# Patient Record
Sex: Male | Born: 1955 | Race: White | Hispanic: No | Smoking: Current some day smoker
Health system: Southern US, Community
[De-identification: ages and names within clinical notes are randomized; demographics above are authoritative.]

## PROBLEM LIST (undated history)

## (undated) DIAGNOSIS — E800 Hereditary erythropoietic porphyria: Secondary | ICD-10-CM

## (undated) DIAGNOSIS — C801 Malignant (primary) neoplasm, unspecified: Secondary | ICD-10-CM

## (undated) DIAGNOSIS — I499 Cardiac arrhythmia, unspecified: Secondary | ICD-10-CM

## (undated) DIAGNOSIS — E039 Hypothyroidism, unspecified: Secondary | ICD-10-CM

## (undated) DIAGNOSIS — G4730 Sleep apnea, unspecified: Secondary | ICD-10-CM

## (undated) DIAGNOSIS — E785 Hyperlipidemia, unspecified: Secondary | ICD-10-CM

## (undated) HISTORY — PX: MOUTH SURGERY: SHX715

## (undated) HISTORY — PX: KNEE ARTHROSCOPY: SUR90

---

## 2020-09-14 ENCOUNTER — Other Ambulatory Visit: Payer: Self-pay | Admitting: Otolaryngology

## 2020-09-14 DIAGNOSIS — D110 Benign neoplasm of parotid gland: Secondary | ICD-10-CM

## 2020-09-27 ENCOUNTER — Ambulatory Visit
Admission: RE | Admit: 2020-09-27 | Discharge: 2020-09-27 | Disposition: A | Discharge status: Home / Self Care | Payer: Medicare Other | Source: Ambulatory Visit | Attending: Otolaryngology | Admitting: Otolaryngology

## 2020-09-27 ENCOUNTER — Other Ambulatory Visit: Payer: Self-pay

## 2020-09-27 DIAGNOSIS — D110 Benign neoplasm of parotid gland: Secondary | ICD-10-CM | POA: Diagnosis present

## 2020-09-27 IMAGING — MR MR FACE/TRIGEMINAL WO/W CM
5 of 8 series · 22 of 48 positions shown · IV contrast (gadavist)
Comparison: No pertinent prior exam.

CLINICAL DATA: 64-year-old male with palpable abnormality of the
parotid region. First noticed in [REDACTED]. No pain or surgery.

EXAM:
MRI FACE TRIGEMINAL WITHOUT AND WITH CONTRAST
TECHNIQUE: Multiplanar, multi-echo pulse sequences of the face and surrounding
structures, including thin-slice imaging of the trigeminal nerves,
were acquired before and after intravenous contrast administration.
CONTRAST:  9mL GADAVIST GADOBUTROL 1 MMOL/ML IV SOLN

[Series 5: T1 · sagittal · 3.0mm · 0.35mm/px · 6 of 40 slices shown (1 of 3)]
[im 1/40]
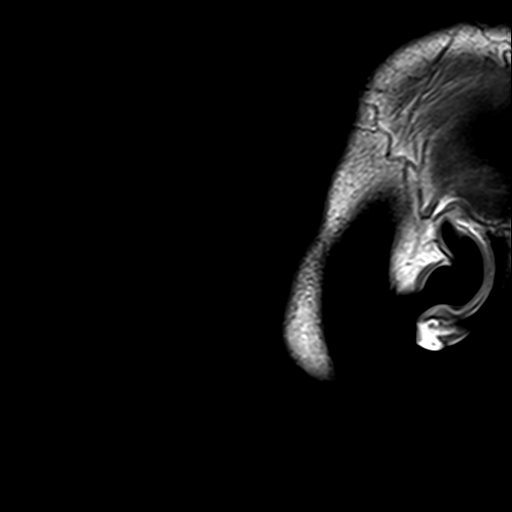
[im 8/40]
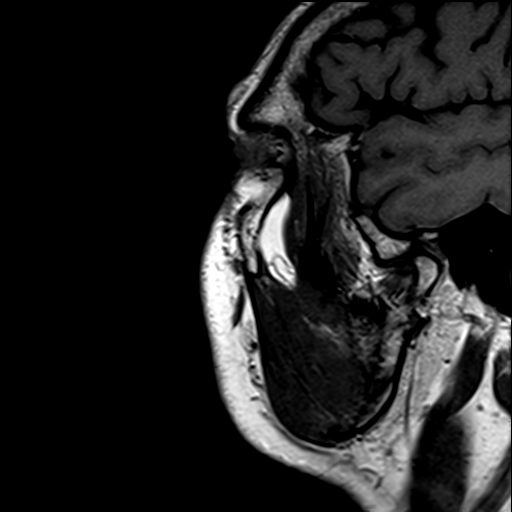
[im 16/40]
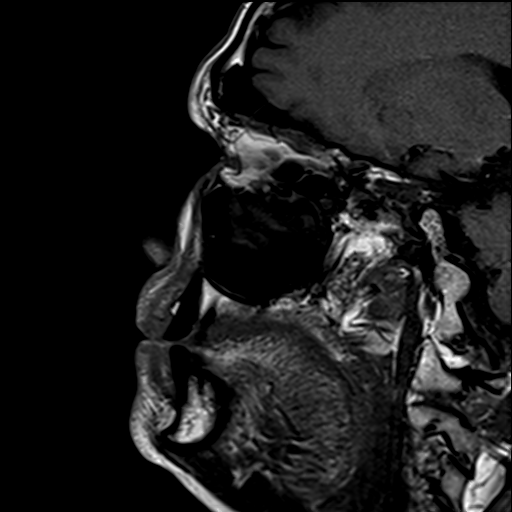
[im 24/40]
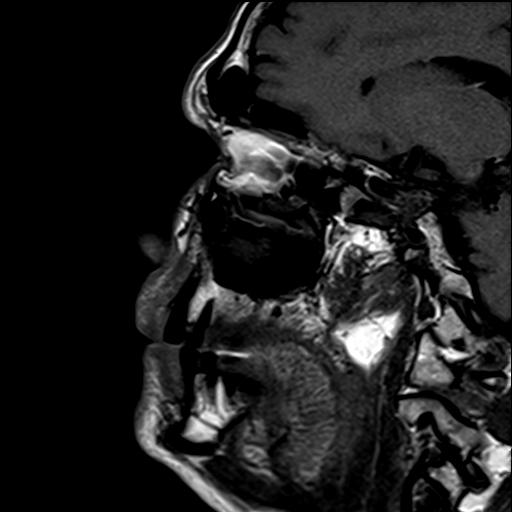
[im 32/40]
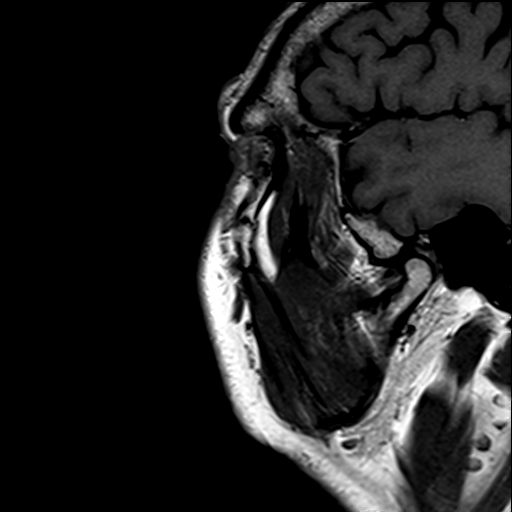
[im 40/40]
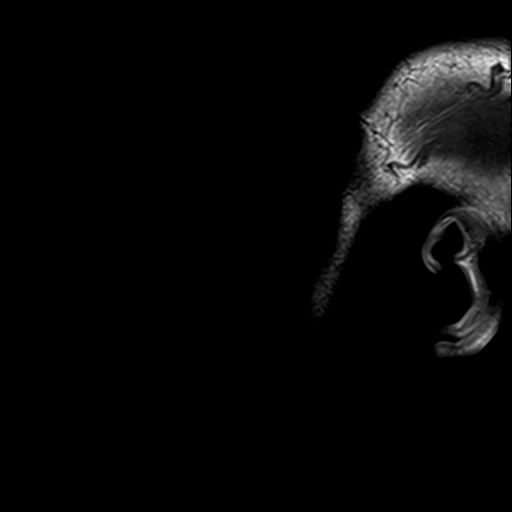

[Series 6: T2 · coronal · 3.0mm · 0.70mm/px · 5 of 40 slices shown]
[im 1/40]
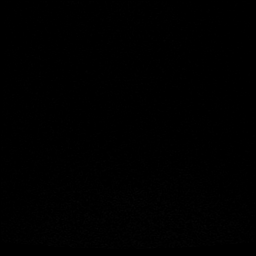
[im 10/40]
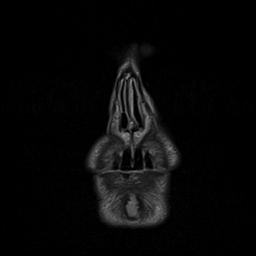
[im 20/40]
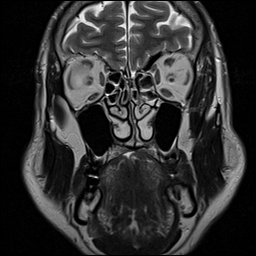
[im 30/40]
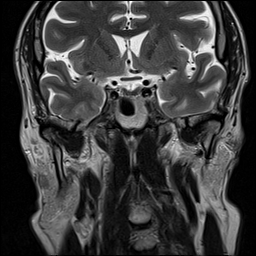
[im 40/40]
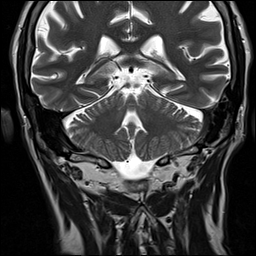

[Series 9: T1 · axial · 3.0mm · 0.35mm/px · z∈[-163,-28]mm · 5 of 35 slices shown (2 of 3)]
[im 1/35]
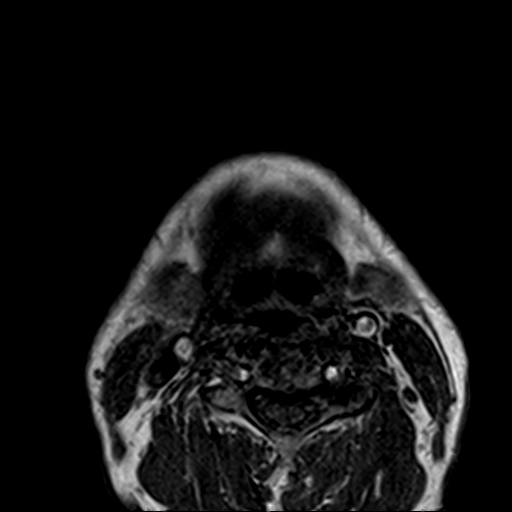
[im 9/35]
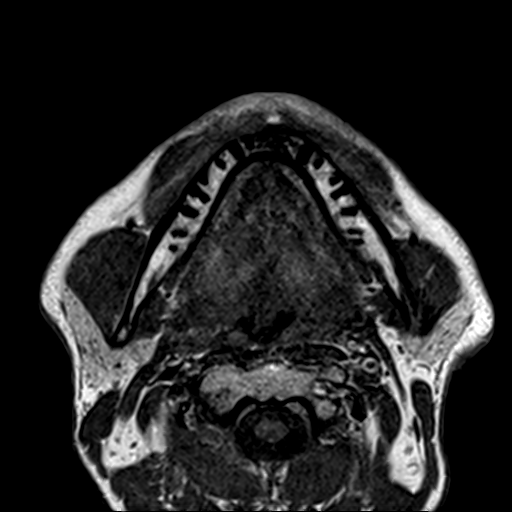
[im 18/35]
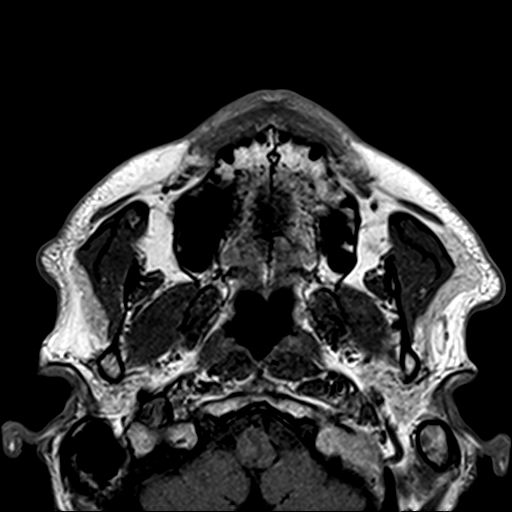
[im 26/35]
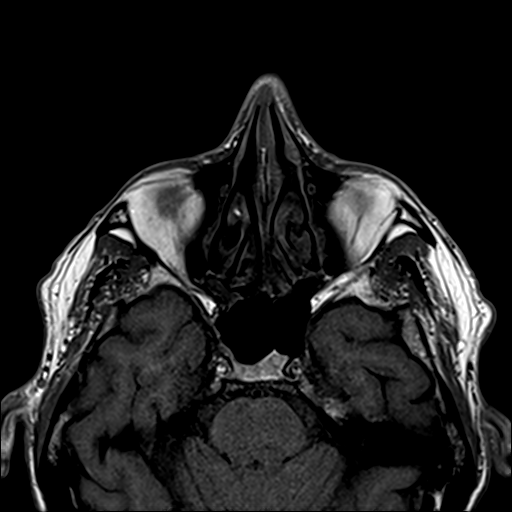
[im 35/35]
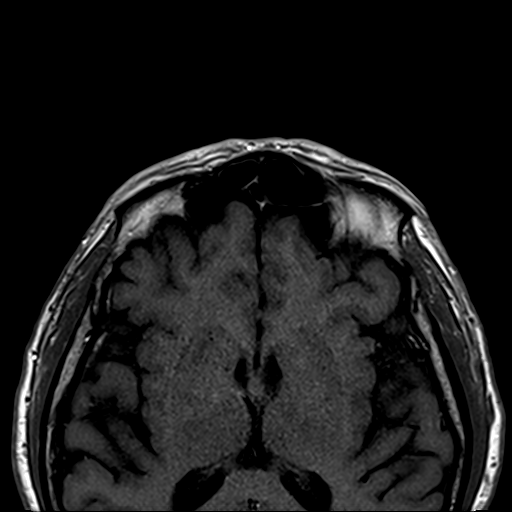

[Series 10: T1 · coronal · 3.0mm · 0.35mm/px · 5 of 40 slices shown (3 of 3)]
[im 1/40]
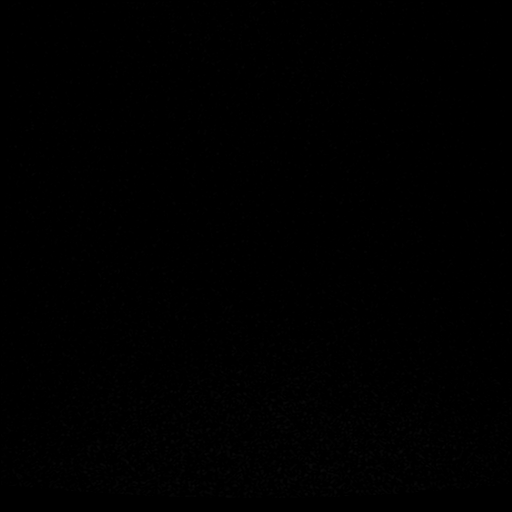
[im 10/40]
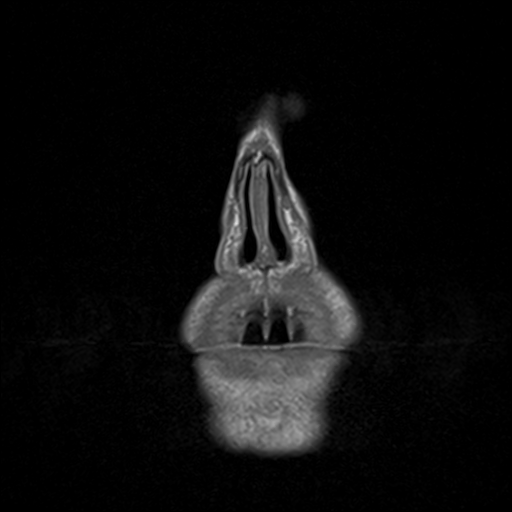
[im 20/40]
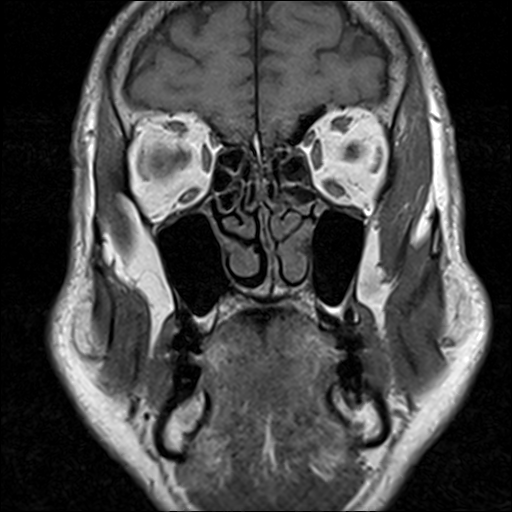
[im 30/40]
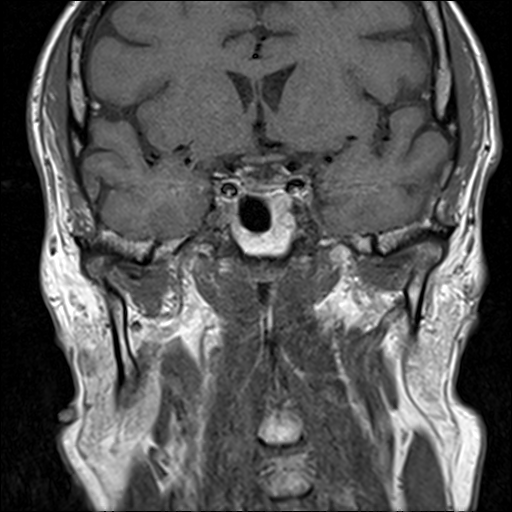
[im 40/40]
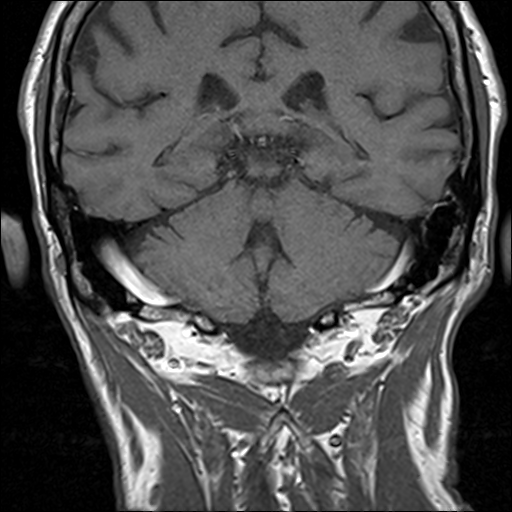

[Series 11: T1 fat-sat post-contrast · axial · 3.0mm · 0.35mm/px · 1 of 35 slices shown]
[im 1/35]
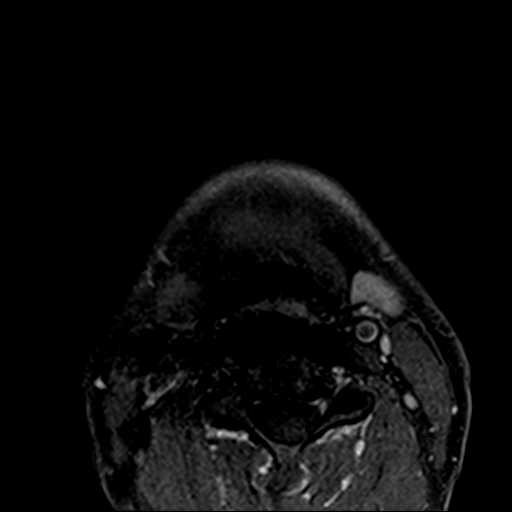

[22 of 48 positions shown; findings below may reference images not displayed]

FINDINGS: Vitamin-E capsules were placed above (series 9 image 14) and below
(image 8) the palpable area of concern. In the superficial lobe of
the right parotid gland an oval fairly circumscribed STIR
hyperintense, T1 hypointense, T2 hypointense (series 6, image 10),
and enhancing soft tissue nodule measures 12 x 10 by 15 mm (AP by
transverse by CC). The remaining right parotid parenchyma is normal.
The right stylomastoid foramen remains normal. Right mastoid is
clear. No abnormal right 7th nerve enhancement identified, with
symmetric and normal appearing internal auditory canals, cochlear
and vestibular structures.

The contralateral left parotid gland is normal. Negative visible
pharynx, parapharyngeal spaces, retropharyngeal space, sublingual
space, submandibular glands, masticator spaces.

No upper cervical lymphadenopathy.

Visible major vascular flow voids are preserved. Visible brain
parenchyma appears unremarkable for age. No abnormal intracranial
enhancement or dural thickening. Negative orbits; postoperative
changes to the left globe. Trace paranasal sinus mucosal
thickening.
IMPRESSION: 1. Small solitary 15 mm soft tissue mass of the superficial lobe
right parotid gland is circumscribed but has some features of
hypercellularity. No other complicating features. Benign etiology is
favored, but small malignant primary salivary gland neoplasm is not
excluded.

2. Otherwise negative MRI appearance of the Face.

## 2020-09-27 MED ORDER — GADOBUTROL 1 MMOL/ML IV SOLN
9.0000 mL | Freq: Once | INTRAVENOUS | Status: AC | PRN
  Administered 2020-09-27: 9 mL via INTRAVENOUS

## 2020-11-28 ENCOUNTER — Other Ambulatory Visit: Payer: Self-pay

## 2020-11-28 ENCOUNTER — Encounter
Admission: RE | Admit: 2020-11-28 | Discharge: 2020-11-28 | Disposition: A | Discharge status: Home / Self Care | Payer: Medicare Other | Source: Ambulatory Visit | Attending: Otolaryngology | Admitting: Otolaryngology

## 2020-11-28 HISTORY — DX: Sleep apnea, unspecified: G47.30

## 2020-11-28 HISTORY — DX: Cardiac arrhythmia, unspecified: I49.9

## 2020-11-28 HISTORY — DX: Hereditary erythropoietic porphyria: E80.0

## 2020-11-28 HISTORY — DX: Malignant (primary) neoplasm, unspecified: C80.1

## 2020-11-28 HISTORY — DX: Hyperlipidemia, unspecified: E78.5

## 2020-11-28 HISTORY — DX: Hypothyroidism, unspecified: E03.9

## 2020-11-28 NOTE — Patient Instructions (Signed)
Your procedure is scheduled on: Tues 8/9 Report to [facility] Registration Desk then to 2nd floor Day Surgery. To find out your arrival time please call [phone] between 1PM - 3PM on Mon. 8/8  Remember: Instructions that are not followed completely may result in serious medical risk,  up to and including death, or upon the discretion of your surgeon and anesthesiologist your  surgery may need to be rescheduled.     _X__ 1. Do not eat food after midnight the night before your procedure.                 No chewing gum or hard candies. You may drink clear liquids up to 2 hours                 before you are scheduled to arrive for your surgery- DO not drink clear                 liquids within 2 hours of the start of your surgery.                 Clear Liquids include:  water, apple juice without pulp, clear Gatorade, G2 or                  Gatorade Zero (avoid Red/Purple/Blue), Black Coffee or Tea (Do not add                 anything to coffee or tea). _____2.   Complete the "Ensure Clear Pre-surgery Clear Carbohydrate Drink"       provided to you, 2 hours before arrival. **If you are diabetic you will be       provided with an alternative drink, Gatorade Zero or G2.  __X__2.  On the morning of surgery brush your teeth with toothpaste and water, you                may rinse your mouth with mouthwash if you wish.  Do not swallow any         toothpaste of mouthwash.     ___ 3.  No Alcohol for 24 hours before or after surgery.   _X__ 4.  Do Not Smoke or use e-cigarettes For 24 Hours Prior to Your Surgery.                 Do not use any chewable tobacco products for at least 6 hours prior to                 Surgery.  ___  5.  Do not use any recreational drugs (marijuana, cocaine, heroin, ecstasy,       MDMA or other) For at least one week prior to your surgery.            Combination of these drugs with anesthesia may have life threatening       results.  ____  6.   Bring all medications with you on the day of surgery if instructed.   __x__  7.  Notify your doctor if there is any change in your medical condition      (cold, fever, infections).     Do not wear jewelry, . Do not wear lotions, You may wear deodorant. Do not shave 48 hours prior to surgery.  Do not bring valuables to the hospital.    [facility] is not responsible for any belongings or valuables.  Contacts, dentures or bridgework may not be worn into surgery. Leave  your suitcase in the car. After surgery it may be brought to your room. For patients admitted to the hospital, discharge time is determined by your treatment team.   Patients discharged the day of surgery will not be allowed to drive home.   Make arrangements for someone to be with you for the first 24 hours of your Same Day Discharge.    Please read over the following fact sheets that you were given:      __x__ Take these medicines the morning of surgery with A SIP OF WATER:    1. ARMOUR THYROID 90 MG tablet  2.   3.   4.  5.  6.  ____ Fleet Enema (as directed)   __x__ Use CHG Soap (or wipes) as directed  ____ Use Benzoyl Peroxide Gel as instructed  ____ Use inhalers on the day of surgery  ____ Stop metformin 2 days prior to surgery    ____ Take 1/2 of usual insulin dose the night before surgery. No insulin the morning          of surgery.   ____ Stop Coumadin/Plavix/aspirin on   __x__ Stop Anti-inflammatories No ibuprofen aleve or Aspirin until after surgery.   ____ Stop supplements until after surgery.    ____ Bring C-Pap to the hospital.    If you have any questions regarding your pre-procedure instructions,  Please call Pre-admit Testing at [phone]

## 2020-11-29 ENCOUNTER — Other Ambulatory Visit: Payer: Self-pay

## 2020-11-29 ENCOUNTER — Encounter: Admission: RE | Admit: 2020-11-29 | Payer: Medicare Other | Source: Ambulatory Visit

## 2020-11-29 ENCOUNTER — Encounter
Admission: RE | Admit: 2020-11-29 | Discharge: 2020-11-29 | Disposition: A | Discharge status: Home / Self Care | Payer: Medicare Other | Source: Ambulatory Visit | Attending: Otolaryngology | Admitting: Otolaryngology

## 2020-11-29 DIAGNOSIS — Z01810 Encounter for preprocedural cardiovascular examination: Secondary | ICD-10-CM | POA: Diagnosis present

## 2020-12-05 ENCOUNTER — Ambulatory Visit: Payer: Medicare Other | Admitting: Urgent Care

## 2020-12-05 ENCOUNTER — Observation Stay
Admission: RE | Admit: 2020-12-05 | Discharge: 2020-12-06 | Disposition: A | Discharge status: Home / Self Care | Payer: Medicare Other | Attending: Otolaryngology | Admitting: Otolaryngology

## 2020-12-05 ENCOUNTER — Encounter: Payer: Self-pay | Admitting: Otolaryngology

## 2020-12-05 ENCOUNTER — Other Ambulatory Visit: Payer: Self-pay

## 2020-12-05 ENCOUNTER — Encounter
Admission: RE | Disposition: A | Discharge status: Home / Self Care | Payer: Self-pay | Source: Home / Self Care | Attending: Otolaryngology

## 2020-12-05 DIAGNOSIS — F17200 Nicotine dependence, unspecified, uncomplicated: Secondary | ICD-10-CM | POA: Diagnosis not present

## 2020-12-05 DIAGNOSIS — D110 Benign neoplasm of parotid gland: Principal | ICD-10-CM | POA: Insufficient documentation

## 2020-12-05 DIAGNOSIS — Z9889 Other specified postprocedural states: Secondary | ICD-10-CM

## 2020-12-05 DIAGNOSIS — E039 Hypothyroidism, unspecified: Secondary | ICD-10-CM | POA: Insufficient documentation

## 2020-12-05 DIAGNOSIS — K118 Other diseases of salivary glands: Secondary | ICD-10-CM | POA: Diagnosis present

## 2020-12-05 HISTORY — PX: PAROTIDECTOMY: SHX2163

## 2020-12-05 SURGERY — EXCISION, PAROTID GLAND
Anesthesia: General | Laterality: Right

## 2020-12-05 MED ORDER — 0.9 % SODIUM CHLORIDE (POUR BTL) OPTIME
TOPICAL | Status: DC | PRN
  Administered 2020-12-05: 200 mL

## 2020-12-05 MED ORDER — ACETAMINOPHEN 650 MG RE SUPP
650.0000 mg | RECTAL | Status: DC | PRN
  Filled 2020-12-05: qty 1

## 2020-12-05 MED ORDER — LIDOCAINE HCL (PF) 2 % IJ SOLN
INTRAMUSCULAR | Status: AC
  Filled 2020-12-05: qty 5

## 2020-12-05 MED ORDER — LIDOCAINE HCL (CARDIAC) PF 100 MG/5ML IV SOSY
PREFILLED_SYRINGE | INTRAVENOUS | Status: DC | PRN
  Administered 2020-12-05: 100 mg via INTRAVENOUS

## 2020-12-05 MED ORDER — ONDANSETRON HCL 4 MG/2ML IJ SOLN
INTRAMUSCULAR | Status: AC
  Filled 2020-12-05: qty 2

## 2020-12-05 MED ORDER — ZOLPIDEM TARTRATE 5 MG PO TABS: 5.0000 mg | ORAL_TABLET | Freq: Every evening | ORAL | Status: DC | PRN

## 2020-12-05 MED ORDER — ACETAMINOPHEN 10 MG/ML IV SOLN
INTRAVENOUS | Status: DC | PRN
  Administered 2020-12-05: 1000 mg via INTRAVENOUS

## 2020-12-05 MED ORDER — ONDANSETRON HCL 4 MG/2ML IJ SOLN: 4.0000 mg | INTRAMUSCULAR | Status: DC | PRN

## 2020-12-05 MED ORDER — BACITRACIN ZINC 500 UNIT/GM EX OINT
TOPICAL_OINTMENT | CUTANEOUS | Status: AC
  Filled 2020-12-05: qty 28.35

## 2020-12-05 MED ORDER — ATORVASTATIN CALCIUM 20 MG PO TABS
20.0000 mg | ORAL_TABLET | Freq: Every day | ORAL | Status: DC
  Administered 2020-12-05: 20 mg via ORAL
  Filled 2020-12-05: qty 1

## 2020-12-05 MED ORDER — FENTANYL CITRATE (PF) 100 MCG/2ML IJ SOLN: 25.0000 ug | INTRAMUSCULAR | Status: DC | PRN

## 2020-12-05 MED ORDER — GLYCOPYRROLATE 0.2 MG/ML IJ SOLN
INTRAMUSCULAR | Status: AC
  Filled 2020-12-05: qty 1

## 2020-12-05 MED ORDER — DEXMEDETOMIDINE (PRECEDEX) IN NS 20 MCG/5ML (4 MCG/ML) IV SYRINGE
PREFILLED_SYRINGE | INTRAVENOUS | Status: DC | PRN
  Administered 2020-12-05 (×2): 8 ug via INTRAVENOUS
  Administered 2020-12-05: 4 ug via INTRAVENOUS

## 2020-12-05 MED ORDER — MIDAZOLAM HCL 2 MG/2ML IJ SOLN
INTRAMUSCULAR | Status: DC | PRN
  Administered 2020-12-05: 2 mg via INTRAVENOUS

## 2020-12-05 MED ORDER — FENTANYL CITRATE (PF) 100 MCG/2ML IJ SOLN
INTRAMUSCULAR | Status: DC | PRN
  Administered 2020-12-05 (×2): 25 ug via INTRAVENOUS
  Administered 2020-12-05: 50 ug via INTRAVENOUS

## 2020-12-05 MED ORDER — MIDAZOLAM HCL 2 MG/2ML IJ SOLN
INTRAMUSCULAR | Status: AC
  Filled 2020-12-05: qty 2

## 2020-12-05 MED ORDER — BACITRACIN 500 UNIT/GM EX OINT
1.0000 "application " | TOPICAL_OINTMENT | Freq: Three times a day (TID) | CUTANEOUS | Status: DC
  Administered 2020-12-05 – 2020-12-06 (×3): 1 via TOPICAL
  Filled 2020-12-05 (×4): qty 0.9

## 2020-12-05 MED ORDER — ORAL CARE MOUTH RINSE: 15.0000 mL | Freq: Once | OROMUCOSAL | Status: AC

## 2020-12-05 MED ORDER — VITAMIN D3 25 MCG (1000 UNIT) PO TABS
5000.0000 [IU] | ORAL_TABLET | Freq: Every day | ORAL | Status: DC
  Administered 2020-12-05: 5000 [IU] via ORAL
  Filled 2020-12-05 (×3): qty 5

## 2020-12-05 MED ORDER — LIDOCAINE-EPINEPHRINE 1 %-1:100000 IJ SOLN
INTRAMUSCULAR | Status: AC
  Filled 2020-12-05: qty 1

## 2020-12-05 MED ORDER — ACETAMINOPHEN 10 MG/ML IV SOLN
INTRAVENOUS | Status: AC
  Filled 2020-12-05: qty 100

## 2020-12-05 MED ORDER — LIDOCAINE-EPINEPHRINE 1 %-1:100000 IJ SOLN
INTRAMUSCULAR | Status: DC | PRN
  Administered 2020-12-05: 6 mL

## 2020-12-05 MED ORDER — SUCCINYLCHOLINE CHLORIDE 200 MG/10ML IV SOSY
PREFILLED_SYRINGE | INTRAVENOUS | Status: DC | PRN
  Administered 2020-12-05: 120 mg via INTRAVENOUS

## 2020-12-05 MED ORDER — SODIUM CHLORIDE 0.9 % IV SOLN
INTRAVENOUS | Status: DC | PRN
  Administered 2020-12-05: 50 ug/min via INTRAVENOUS

## 2020-12-05 MED ORDER — SUCCINYLCHOLINE CHLORIDE 200 MG/10ML IV SOSY
PREFILLED_SYRINGE | INTRAVENOUS | Status: AC
  Filled 2020-12-05: qty 10

## 2020-12-05 MED ORDER — FAMOTIDINE 20 MG PO TABS: 20.0000 mg | ORAL_TABLET | Freq: Once | ORAL | Status: AC

## 2020-12-05 MED ORDER — HEMOSTATIC AGENTS (NO CHARGE) OPTIME
TOPICAL | Status: DC | PRN
Start: 2020-12-05 — End: 2020-12-05
  Administered 2020-12-05: 1 via TOPICAL

## 2020-12-05 MED ORDER — SENNOSIDES-DOCUSATE SODIUM 8.6-50 MG PO TABS: 1.0000 | ORAL_TABLET | Freq: Every evening | ORAL | Status: DC | PRN

## 2020-12-05 MED ORDER — PHENYLEPHRINE HCL (PRESSORS) 10 MG/ML IV SOLN
INTRAVENOUS | Status: DC | PRN
  Administered 2020-12-05 (×3): 100 ug via INTRAVENOUS

## 2020-12-05 MED ORDER — ONDANSETRON HCL 4 MG PO TABS: 4.0000 mg | ORAL_TABLET | ORAL | Status: DC | PRN

## 2020-12-05 MED ORDER — ONDANSETRON HCL 4 MG/2ML IJ SOLN
INTRAMUSCULAR | Status: DC | PRN
  Administered 2020-12-05: 4 mg via INTRAVENOUS

## 2020-12-05 MED ORDER — BACITRACIN ZINC 500 UNIT/GM EX OINT
TOPICAL_OINTMENT | CUTANEOUS | Status: DC | PRN
  Administered 2020-12-05: 1 via TOPICAL

## 2020-12-05 MED ORDER — PROPOFOL 10 MG/ML IV BOLUS
INTRAVENOUS | Status: DC | PRN
Start: 2020-12-05 — End: 2020-12-05
  Administered 2020-12-05: 200 mg via INTRAVENOUS

## 2020-12-05 MED ORDER — CHLORHEXIDINE GLUCONATE 0.12 % MT SOLN: 15.0000 mL | Freq: Once | OROMUCOSAL | Status: AC

## 2020-12-05 MED ORDER — FAMOTIDINE 20 MG PO TABS
ORAL_TABLET | ORAL | Status: AC
  Administered 2020-12-05: 20 mg via ORAL
  Filled 2020-12-05: qty 1

## 2020-12-05 MED ORDER — DEXAMETHASONE SODIUM PHOSPHATE 10 MG/ML IJ SOLN
INTRAMUSCULAR | Status: AC
  Filled 2020-12-05: qty 1

## 2020-12-05 MED ORDER — HYDROCODONE-ACETAMINOPHEN 5-325 MG PO TABS: 1.0000 | ORAL_TABLET | ORAL | Status: DC | PRN

## 2020-12-05 MED ORDER — CHLORHEXIDINE GLUCONATE 0.12 % MT SOLN
OROMUCOSAL | Status: AC
  Administered 2020-12-05: 15 mL via OROMUCOSAL
  Filled 2020-12-05: qty 15

## 2020-12-05 MED ORDER — ACETAMINOPHEN 160 MG/5ML PO SOLN
650.0000 mg | ORAL | Status: DC | PRN
  Filled 2020-12-05: qty 20.3

## 2020-12-05 MED ORDER — LACTATED RINGERS IV SOLN: INTRAVENOUS | Status: DC

## 2020-12-05 MED ORDER — ONDANSETRON HCL 4 MG/2ML IJ SOLN: 4.0000 mg | Freq: Once | INTRAMUSCULAR | Status: DC | PRN

## 2020-12-05 MED ORDER — THYROID 60 MG PO TABS
90.0000 mg | ORAL_TABLET | Freq: Every day | ORAL | Status: DC
  Filled 2020-12-05 (×2): qty 1

## 2020-12-05 MED ORDER — DEXAMETHASONE SODIUM PHOSPHATE 10 MG/ML IJ SOLN
INTRAMUSCULAR | Status: DC | PRN
  Administered 2020-12-05: 10 mg via INTRAVENOUS

## 2020-12-05 MED ORDER — SEVOFLURANE IN SOLN
RESPIRATORY_TRACT | Status: AC
  Filled 2020-12-05: qty 250

## 2020-12-05 MED ORDER — SODIUM CHLORIDE 0.45 % IV SOLN: INTRAVENOUS | Status: DC

## 2020-12-05 MED ORDER — FENTANYL CITRATE (PF) 100 MCG/2ML IJ SOLN
INTRAMUSCULAR | Status: AC
  Filled 2020-12-05: qty 2

## 2020-12-05 SURGICAL SUPPLY — 41 items
ADHESIVE MASTISOL STRL (MISCELLANEOUS) ×2 IMPLANT
BLADE SURG 15 STRL LF DISP TIS (BLADE) ×1 IMPLANT
BLADE SURG 15 STRL SS (BLADE) ×1
BULB RESERV EVAC DRAIN JP 100C (MISCELLANEOUS) ×2 IMPLANT
CORD BIP STRL DISP 12FT (MISCELLANEOUS) ×2 IMPLANT
COTTON BALL STRL MEDIUM (GAUZE/BANDAGES/DRESSINGS) ×2 IMPLANT
DERMABOND ADVANCED (GAUZE/BANDAGES/DRESSINGS) ×1
DERMABOND ADVANCED .7 DNX12 (GAUZE/BANDAGES/DRESSINGS) ×1 IMPLANT
DRAIN JP 10F RND SILICONE (MISCELLANEOUS) ×2 IMPLANT
DRAPE MAG INST 16X20 L/F (DRAPES) ×2 IMPLANT
DRAPE SURG 17X11 SM STRL (DRAPES) ×2 IMPLANT
DRSG TEGADERM 2-3/8X2-3/4 SM (GAUZE/BANDAGES/DRESSINGS) ×6 IMPLANT
ELECT EMG 20MM DUAL (MISCELLANEOUS) ×4
ELECT REM PT RETURN 9FT ADLT (ELECTROSURGICAL) ×2
ELECTRODE EMG 20MM DUAL (MISCELLANEOUS) ×2 IMPLANT
ELECTRODE REM PT RTRN 9FT ADLT (ELECTROSURGICAL) ×1 IMPLANT
FORCEPS JEWEL BIP 4-3/4 STR (INSTRUMENTS) ×2 IMPLANT
GAUZE 4X4 16PLY ~~LOC~~+RFID DBL (SPONGE) ×4 IMPLANT
GLOVE SURG ENC MOIS LTX SZ7.5 (GLOVE) ×4 IMPLANT
GOWN STRL REUS W/ TWL LRG LVL3 (GOWN DISPOSABLE) ×3 IMPLANT
GOWN STRL REUS W/TWL LRG LVL3 (GOWN DISPOSABLE) ×3
HEMOSTAT SURGICEL 2X3 (HEMOSTASIS) ×2 IMPLANT
HOOK STAY BLUNT/RETRACTOR 5M (MISCELLANEOUS) ×2 IMPLANT
KIT TURNOVER KIT A (KITS) ×2 IMPLANT
LABEL OR SOLS (LABEL) ×2 IMPLANT
MANIFOLD NEPTUNE II (INSTRUMENTS) ×2 IMPLANT
MARKER SKIN DUAL TIP RULER LAB (MISCELLANEOUS) ×2 IMPLANT
NS IRRIG 500ML POUR BTL (IV SOLUTION) ×2 IMPLANT
PACK HEAD/NECK (MISCELLANEOUS) ×2 IMPLANT
PROBE NEUROSIGN BIPOL (MISCELLANEOUS) ×1 IMPLANT
PROBE NEUROSIGN BIPOLAR (MISCELLANEOUS) ×1
SHEARS HARMONIC 9CM CVD (BLADE) ×2 IMPLANT
SPONGE KITTNER 5P (MISCELLANEOUS) ×4 IMPLANT
STRIP CLOSURE SKIN 1/4X4 (GAUZE/BANDAGES/DRESSINGS) ×2 IMPLANT
SUT PROLENE 5 0 PS 3 (SUTURE) ×2 IMPLANT
SUT SILK 0 (SUTURE) ×1
SUT SILK 0 30XBRD TIE 6 (SUTURE) ×1 IMPLANT
SUT SILK 2 0 (SUTURE) ×1
SUT SILK 2-0 30XBRD TIE 12 (SUTURE) ×1 IMPLANT
SUT VIC AB 4-0 RB1 18 (SUTURE) ×2 IMPLANT
SYSTEM CHEST DRAIN TLS 7FR (DRAIN) IMPLANT

## 2020-12-05 NOTE — Op Note (Signed)
..  12/05/2020  9:47 AM    Barbarann Ehlers  [id number]   Pre-Op Dx: parotid mass  Post-op Dx: right parotid mass  Proc: Right Superficial parotidectomy with facial nerve dissection  Surg: Jeannie Fend Shawnda Mauney  Asst:  McQueen  Anes:  GOT  EBL: <45m  Comp:  none  Findings:  1.5cm mass mid-superficial parotid.  All branches of facial nerve dissected and stimulated at conclusion of case.  Procedure:  After the patient was identified in hold and the history and physical and consent was reviewed and updated.  The patient was marked in the normal fashion in an existing skin crease of his right face and neck beginning in the pre-auricular region.  The patient was next taken to the operating room and placed in a supine position.  General endotracheal anesthesia was induced in the normal fashion.  The patient's right neck was neck and face injected with 6cc's of 1% lidocaine with 1:100,000 Epinephrine.  Facial nerve electrodes were placed in the normal fashion on the right face and hooked up to nerve stimulator machine. The patient was next prepped and draped in a sterile normal fashion.  At this time, a 15 blade scalpel was used to make a skin incision along the previously marked facial and neck crease in the right neck after the proper time out was performed.  Dissection was carefully performed through the subcutaneous tissues with combination of Bovie electrocautery and blunt dissection along the cartilaginous portion of the right EAC.  The SCM was identified inferiorly.  An anterior flap was raised subcutaneously between the parotid fascia and SMAS.  Dural stay sutures were used to retract the raised flap anteriorly and posteriorly.  Dissection was continued along the posterior edge of the parotid tissue separating it from the SCM and the bony EAC.  The temporal-mastoid suture was identified as well as the mastoid tip and styloid process.  Careful dissection was carried along with harmonic scalpel  and stimulation of tissue.  The main branch of the facial nerve was identified in its expected location and robustly stimulated.  Care was taken to avoid over stimulation of the nerve.  This was [facility] inferior and superior main branches carefully dissected with hemostat and Harmonic scalpel.  The branches of the nerve were traced until they were anterior to the mid-superficial parotid mass.  The retro-facial vein was identified and preserved.  The mass was then circumferentially removed in its entirety with extreme care taken to avoid trauma to the facial nerve branches.  Once the mass was removed, the wound was copiously irrigated with sterile saline.  Surgicel was placed in the wound bed.  A TLS drain was placed and the parotid fascia reconnected to the subcutaneous tissue posteriorly.  The subcutaneous tissue was closed with interrupted vicryl sutures and then skin was closed in a running fashion with 5.0 prolene and topped with bacitracin.  At this time the patient was extubated and taken to PACU in good condition.    Dispo: PACU in good condition.  Plan:  Ice, elevation, analgesia.  Admit for observation and drain care.  CJeannie FendVaught 12/05/2020 9:47 AM

## 2020-12-05 NOTE — Progress Notes (Signed)
Facial assessment s/p parotidectomy: speech WDL. Smile a symmetrical decreased on r side. Able to pucker lips and close both eyes and frown.

## 2020-12-05 NOTE — Transfer of Care (Signed)
Immediate Anesthesia Transfer of Care Note  Patient: John Robertson  Procedure(s) Performed: PAROTIDECTOMY WITH FACIAL NERVE MONITORING (Right)  Patient Location: PACU  Anesthesia Type:General  Level of Consciousness: awake  Airway & Oxygen Therapy: Patient Spontanous Breathing and Patient connected to nasal cannula oxygen  Post-op Assessment: Report given to RN and Post -op Vital signs reviewed and stable  Post vital signs: Reviewed and stable  Last Vitals:  Vitals Value Taken Time  BP 135/87 12/05/20 0954  Temp    Pulse 78 12/05/20 0956  Resp 12 12/05/20 0956  SpO2 96 % 12/05/20 0956  Vitals shown include unvalidated device data.  Last Pain:  Vitals:   12/05/20 0608  TempSrc: Temporal  PainSc: 0-No pain         Complications: No notable events documented.

## 2020-12-05 NOTE — Progress Notes (Signed)
Order received from Dr Pryor Ochoa to leave IV out and discontinue cont pulse ox

## 2020-12-05 NOTE — Anesthesia Procedure Notes (Addendum)
Procedure Name: Intubation Date/Time: 12/05/2020 7:39 AM Performed by: Doreen Salvage, CRNA Pre-anesthesia Checklist: Patient identified, Patient being monitored, Timeout performed, Emergency Drugs available and Suction available Patient Re-evaluated:Patient Re-evaluated prior to induction Oxygen Delivery Method: Circle system utilized Preoxygenation: Pre-oxygenation with 100% oxygen Induction Type: IV induction Ventilation: Mask ventilation without difficulty Laryngoscope Size: Mac, 4 and McGraph Grade View: Grade I Tube type: Oral Tube size: 7.5 mm Number of attempts: 1 Airway Equipment and Method: Stylet Placement Confirmation: ETT inserted through vocal cords under direct vision, positive ETCO2 and breath sounds checked- equal and bilateral Secured at: 24 cm Tube secured with: Tape Dental Injury: Teeth and Oropharynx as per pre-operative assessment

## 2020-12-05 NOTE — H&P (Signed)
..  History and Physical paper copy reviewed and updated date of procedure and will be scanned into system.  Patient seen and examined and marked.  

## 2020-12-05 NOTE — Anesthesia Preprocedure Evaluation (Signed)
Anesthesia Evaluation  Patient identified by MRN, date of birth, ID band Patient awake    Reviewed: Allergy & Precautions, H&P , NPO status , Patient's Chart, lab work & pertinent test results, reviewed documented beta blocker date and time   History of Anesthesia Complications Negative for: history of anesthetic complications  Airway Mallampati: II  TM Distance: >3 FB Neck ROM: full    Dental  (+) Dental Advidsory Given, Missing, Caps, Teeth Intact   Pulmonary neg shortness of breath, sleep apnea (history of, but none since weight loss) , neg COPD, neg recent URI, Current Smoker,    Pulmonary exam normal breath sounds clear to auscultation       Cardiovascular Exercise Tolerance: Good negative cardio ROS Normal cardiovascular exam Rhythm:regular Rate:Normal     Neuro/Psych negative neurological ROS  negative psych ROS   GI/Hepatic negative GI ROS, Neg liver ROS,   Endo/Other  neg diabetesHypothyroidism   Renal/GU negative Renal ROS  negative genitourinary   Musculoskeletal   Abdominal   Peds  Hematology negative hematology ROS (+)   Anesthesia Other Findings Past Medical History: No date: 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 No date: Dysrhythmia     Comment:  skips beat 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[facility]) No date: Hyperlipidemia No date: Hypothyroidism No date: Sleep apnea     Comment:  better since loss weight  no CPAP   Reproductive/Obstetrics negative OB ROS                             Anesthesia Physical Anesthesia Plan  ASA: 2  Anesthesia Plan: General   Post-op Pain Management:    Induction: Intravenous  PONV Risk Score and Plan: 1 and TIVA, Propofol infusion, Ondansetron, Dexamethasone and Treatment may vary due to age or medical condition  Airway Management Planned: Oral ETT  Additional Equipment:   Intra-op Plan:   Post-operative Plan:  Extubation in OR  Informed Consent: I have reviewed the patients History and Physical, chart, labs and discussed the procedure including the risks, benefits and alternatives for the proposed anesthesia with the patient or authorized representative who has indicated his/her understanding and acceptance.     Dental Advisory Given  Plan Discussed with: Anesthesiologist, CRNA and Surgeon  Anesthesia Plan Comments:         Anesthesia Quick Evaluation

## 2020-12-05 NOTE — Anesthesia Postprocedure Evaluation (Signed)
Anesthesia Post Note  Patient: John Robertson  Procedure(s) Performed: PAROTIDECTOMY WITH FACIAL NERVE MONITORING (Right)  Patient location during evaluation: PACU Anesthesia Type: General Level of consciousness: awake and alert Pain management: pain level controlled Vital Signs Assessment: post-procedure vital signs reviewed and stable Respiratory status: spontaneous breathing, nonlabored ventilation, respiratory function stable and patient connected to nasal cannula oxygen Cardiovascular status: blood pressure returned to baseline and stable Postop Assessment: no apparent nausea or vomiting Anesthetic complications: no   No notable events documented.   Last Vitals:  Vitals:   12/05/20 1138 12/05/20 1244  BP: 127/80 115/74  Pulse: 65 66  Resp: 16 14  Temp: 36.6 C (!) 36.4 C  SpO2: 95% 96%    Last Pain:  Vitals:   12/05/20 1244  TempSrc: Oral  PainSc:                  Martha Clan

## 2020-12-05 NOTE — Progress Notes (Signed)
..  12/05/2020 5:26 PM  Jasperson, Joneen Caraway [id number]  Post-Op Day 0    Temp:  [97.3 F (36.3 C)-98.2 F (36.8 C)] 98.2 F (36.8 C) (08/09 1521) Pulse Rate:  [64-78] 67 (08/09 1521) Resp:  [9-21] 18 (08/09 1521) BP: (112-143)/(72-94) 112/72 (08/09 1521) SpO2:  [91 %-100 %] 96 % (08/09 1521) Weight:  [102.1 kg] 102.1 kg (08/09 1507),     Intake/Output Summary (Last 24 hours) at 12/05/2020 1726 Last data filed at 12/05/2020 1506 Gross per 24 hour  Intake 1032.66 ml  Output 6 ml  Net 1026.66 ml    No results found for this or any previous visit (from the past 24 hour(s)).  SUBJECTIVE:  No acute events.  Tolerating PO.  Ambulating to BR.  Voiding.  OBJECTIVE:  GEN-  NAD NECK-  incision c/d/I, drain in place with serosanguinous Neuro-  CNVII- mild right buccal/marginal asymmetry with movement  IMPRESSION:  s/p right superficial parotidectomy  PLAN:  Observe overnight and discharge in morning.  Will come by at 6a.m. and discharge drain.  Maurisha Mongeau 12/05/2020, 5:26 PM

## 2020-12-06 DIAGNOSIS — D110 Benign neoplasm of parotid gland: Secondary | ICD-10-CM | POA: Diagnosis not present

## 2020-12-06 MED ORDER — CEPHALEXIN 500 MG PO CAPS: 500.0000 mg | ORAL_CAPSULE | Freq: Three times a day (TID) | ORAL | 0 refills | Status: AC

## 2020-12-06 MED ORDER — HYDROCODONE-ACETAMINOPHEN 5-325 MG PO TABS: 1.0000 | ORAL_TABLET | ORAL | 0 refills | Status: AC | PRN

## 2020-12-06 MED ORDER — BACITRACIN 500 UNIT/GM EX OINT: 1.0000 "application " | TOPICAL_OINTMENT | Freq: Three times a day (TID) | CUTANEOUS | 0 refills | Status: AC

## 2020-12-06 MED ORDER — ONDANSETRON HCL 4 MG PO TABS: 4.0000 mg | ORAL_TABLET | ORAL | 0 refills | Status: AC | PRN

## 2020-12-06 MED ORDER — SENNOSIDES-DOCUSATE SODIUM 8.6-50 MG PO TABS: 1.0000 | ORAL_TABLET | Freq: Every evening | ORAL | 0 refills | Status: AC | PRN

## 2020-12-06 NOTE — Final Progress Note (Signed)
..  12/06/2020 6:46 AM  Bevis, Joneen Caraway [id number]  Post-Op Day 1    Temp:  [97.3 F (36.3 C)-99.2 F (37.3 C)] 98.3 F (36.8 C) (08/10 0526) Pulse Rate:  [61-78] 61 (08/10 0526) Resp:  [9-21] 16 (08/10 0526) BP: (112-127)/(72-84) 119/78 (08/10 0526) SpO2:  [91 %-97 %] 97 % (08/10 0526) Weight:  [102.1 kg] 102.1 kg (08/09 1507),     Intake/Output Summary (Last 24 hours) at 12/06/2020 0646 Last data filed at 12/06/2020 0523 Gross per 24 hour  Intake 1080.01 ml  Output 16 ml  Net 1064.01 ml    No results found for this or any previous visit (from the past 24 hour(s)).  SUBJECTIVE:  No acute events.  Ambulating well.  Pain well controlled.  No N/V.  OBJECTIVE:  GEN-NAD, supine in bed NECK-  drain removed with serosanguinous drainage, mild edema inferiorly.  Incision c/d/I NEURO-  CNVII with good symmetry at rest and tone but some mild weakness of right buccal branch with smiling, stable from yesterday  IMPRESSION:  s/p superficial parotidectomy  PLAN:  ok to discharge home with follow up Tuesday for suture removal.  Ice for 48 hours then heat.  Limit straining.  Due to some seroma inferiorly, will place on antibiotics.  Saveah Bahar 12/06/2020, 6:46 AM  1

## 2020-12-06 NOTE — Progress Notes (Signed)
Patient discharge instructions reviewed with patient including medications and appointment follow up who verbalized understanding.

## 2020-12-07 LAB — SURGICAL PATHOLOGY
